# Patient Record
Sex: Female | Born: 2000 | Race: White | Hispanic: No | Marital: Single | Smoking: Never smoker
Health system: Southern US, Community
[De-identification: ages and names within clinical notes are randomized; demographics above are authoritative.]

## PROBLEM LIST (undated history)

## (undated) DIAGNOSIS — R531 Weakness: Secondary | ICD-10-CM

## (undated) HISTORY — PX: MYRINGOTOMY: SUR874

## (undated) HISTORY — PX: ADENOIDECTOMY: SUR15

## (undated) HISTORY — PX: OTHER SURGICAL HISTORY: SHX169

## (undated) HISTORY — PX: NOSE SURGERY: SHX723

## (undated) HISTORY — DX: Weakness: R53.1

---
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
## 2022-01-05 ENCOUNTER — Encounter: Payer: Self-pay | Admitting: Nurse Practitioner

## 2022-01-05 ENCOUNTER — Ambulatory Visit (INDEPENDENT_AMBULATORY_CARE_PROVIDER_SITE_OTHER): Payer: BC Managed Care – PPO | Admitting: Nurse Practitioner

## 2022-01-05 VITALS — BP 120/70 | HR 85 | Ht 66.5 in | Wt 183.0 lb

## 2022-01-05 DIAGNOSIS — N898 Other specified noninflammatory disorders of vagina: Secondary | ICD-10-CM | POA: Diagnosis not present

## 2022-01-05 DIAGNOSIS — Z8349 Family history of other endocrine, nutritional and metabolic diseases: Secondary | ICD-10-CM

## 2022-01-05 DIAGNOSIS — Z01419 Encounter for gynecological examination (general) (routine) without abnormal findings: Secondary | ICD-10-CM

## 2022-01-05 DIAGNOSIS — Z3046 Encounter for surveillance of implantable subdermal contraceptive: Secondary | ICD-10-CM

## 2022-01-05 LAB — WET PREP FOR TRICH, YEAST, CLUE

## 2022-01-05 NOTE — Progress Notes (Signed)
   Makayla [name] 2000/11/04 [id number]   History:  22 y.o. G0 presents for annual exam. Nexplanon 12/2019. Light irregular bleeding. H/O chlamydia 2021. Gardasil series completed. Mother with thyroid disease. Complains of intermittent discharge and odor.   Gynecologic History Patient's last menstrual period was 12/31/2021.   Contraception/Family planning: Nexplanon Sexually active: Yes, declines STD screening  Health Maintenance Last Pap: Not indicated Last mammogram: Not indicated Last colonoscopy: Not indicated Last Dexa: Not indicated   Past medical history, past surgical history, family history and social history were all reviewed and documented in the EPIC chart. 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 Studying abroad in [place] this semester.   ROS:  A ROS was performed and pertinent positives and negatives are included.  Exam:  Vitals:   01/05/22 1159  BP: 120/70  Pulse: 85  SpO2: 100%  Weight: 183 lb (83 kg)  Height: 5' 6.5" (1.689 m)    Body mass index is 29.09 kg/m.  General appearance:  Normal Thyroid:  Symmetrical, normal in size, without palpable masses or nodularity. Respiratory  Auscultation:  Clear without wheezing or rhonchi Cardiovascular  Auscultation:  Regular rate, without rubs, murmurs or gallops  Edema/varicosities:  Not grossly evident Abdominal  Soft,nontender, without masses, guarding or rebound.  Liver/spleen:  No organomegaly noted  Hernia:  None appreciated  Skin  Inspection:  Grossly normal   Breasts: Not indicated per guidelines Genitourinary   Inguinal/mons:  Normal without inguinal adenopathy  External genitalia:  Normal appearing vulva with no masses, tenderness, or lesions  BUS/Urethra/Skene's glands:  Normal  Vagina:  Normal appearing with normal color and discharge, no lesions  Cervix:  Normal appearing without discharge or lesions  Uterus:  Normal in size, shape and contour.  Midline and mobile,  nontender  Adnexa/parametria:     Rt: Normal in size, without masses or tenderness.   Lt: Normal in size, without masses or tenderness.  Anus and perineum: Normal  Patient informed chaperone available to be present for breast and pelvic exam. Patient has requested no chaperone to be present. Patient has been advised what will be completed during breast and pelvic exam.   Wet prep negative for pathogens  Assessment/Plan:  22 y.o. G0 for annual exam.   Well female exam with routine gynecological exam - Plan: CBC with Differential/Platelet, Comprehensive metabolic panel. Education provided on SBEs, importance of preventative screenings, current guidelines, high calcium diet, regular exercise, and multivitamin daily.   Family history of thyroid disease in mother - Plan: TSH  Vaginal odor - Plan: WET PREP FOR [place], [place], [place]. Negative wet prep and exam.   Encounter for surveillance of implantable subdermal contraceptive - Nexplanon inserted 12/2019. Light irregular bleeding.   Follow up in 1 year for annual.       Tamela Gammon [facility], 12:22 PM 01/05/2022

## 2022-01-06 LAB — COMPREHENSIVE METABOLIC PANEL
AG Ratio: 1.7 (calc) (ref 1.0–2.5)
ALT: 16 U/L (ref 5–32)
AST: 16 U/L (ref 12–32)
Albumin: 4.5 g/dL (ref 3.6–5.1)
Alkaline phosphatase (APISO): 61 U/L (ref 36–128)
BUN: 14 mg/dL (ref 7–20)
CO2: 22 mmol/L (ref 20–32)
Calcium: 9.4 mg/dL (ref 8.9–10.4)
Chloride: 105 mmol/L (ref 98–110)
Creat: 0.8 mg/dL (ref 0.50–0.96)
Globulin: 2.6 g/dL (calc) (ref 2.0–3.8)
Glucose, Bld: 89 mg/dL (ref 65–99)
Potassium: 4 mmol/L (ref 3.8–5.1)
Sodium: 140 mmol/L (ref 135–146)
Total Bilirubin: 0.3 mg/dL (ref 0.2–1.1)
Total Protein: 7.1 g/dL (ref 6.3–8.2)

## 2022-01-06 LAB — CBC WITH DIFFERENTIAL/PLATELET
Absolute Monocytes: 501 cells/uL (ref 200–950)
Basophils Absolute: 39 cells/uL (ref 0–200)
Basophils Relative: 0.7 %
Eosinophils Absolute: 319 cells/uL (ref 15–500)
Eosinophils Relative: 5.8 %
HCT: 43.6 % (ref 35.0–45.0)
Hemoglobin: 14.6 g/dL (ref 11.7–15.5)
Lymphs Abs: 1419 cells/uL (ref 850–3900)
MCH: 29.8 pg (ref 27.0–33.0)
MCHC: 33.5 g/dL (ref 32.0–36.0)
MCV: 89 fL (ref 80.0–100.0)
MPV: 9.8 fL (ref 7.5–12.5)
Monocytes Relative: 9.1 %
Neutro Abs: 3223 cells/uL (ref 1500–7800)
Neutrophils Relative %: 58.6 %
Platelets: 335 10*3/uL (ref 140–400)
RBC: 4.9 10*6/uL (ref 3.80–5.10)
RDW: 11.7 % (ref 11.0–15.0)
Total Lymphocyte: 25.8 %
WBC: 5.5 10*3/uL (ref 3.8–10.8)

## 2022-01-06 LAB — TSH: TSH: 3.8 mIU/L

## 2022-06-01 ENCOUNTER — Encounter: Payer: Self-pay | Admitting: Nurse Practitioner

## 2022-06-01 ENCOUNTER — Ambulatory Visit (INDEPENDENT_AMBULATORY_CARE_PROVIDER_SITE_OTHER): Payer: BC Managed Care – PPO | Admitting: Nurse Practitioner

## 2022-06-01 VITALS — BP 110/62 | HR 76 | Resp 20

## 2022-06-01 DIAGNOSIS — N921 Excessive and frequent menstruation with irregular cycle: Secondary | ICD-10-CM | POA: Diagnosis not present

## 2022-06-01 DIAGNOSIS — Z975 Presence of (intrauterine) contraceptive device: Secondary | ICD-10-CM

## 2022-06-01 NOTE — Progress Notes (Signed)
   Acute Office Visit  Subjective:    Patient ID: Makayla [name], female    DOB: 2000-06-03, 22 y.o.   MRN: [id number]   HPI 22 y.o. G0 presents today for irregular bleeding. While studying abroad this spring she had bleeding about every 9 days February - April. Bleeding was heavy enough at times to exchange tampons 2-3 times per day. LMP 04/19/2022. Nexplanon 12/2019. Not sexually active since December. Denies vaginal symptoms.   Patient's last menstrual period was 04/19/2022 (exact date). Period Duration (Days): 9 Period Pattern: (!) Irregular Menstrual Flow: Light Menstrual Control: Tampon Menstrual Control Change Freq (Hours): 2-3 Dysmenorrhea: (!) Mild Dysmenorrhea Symptoms: Cramping  Review of Systems  Constitutional: Negative.   Genitourinary:  Positive for menstrual problem. Negative for vaginal discharge and vaginal pain.       Objective:    Physical Exam Constitutional:      Appearance: Normal appearance.   GU: Not indicated  BP 110/62 (BP Location: Right Arm, Patient Position: Sitting)   Pulse 76   Resp 20   LMP 04/19/2022 (Exact Date)   SpO2 96%  Wt Readings from Last 3 Encounters:  01/05/22 183 lb (83 kg) (95 %, Z= 1.66)*  11/19/19 161 lb (73 kg) (91 %, Z= 1.33)*  10/17/18 171 lb 6.4 oz (77.7 kg) (95 %, Z= 1.61)*   * Growth percentiles are based on CDC (Girls, 2-20 Years) data.        Assessment & Plan:   Problem List Items Addressed This Visit   None Visit Diagnoses     Breakthrough bleeding on Nexplanon    -  Primary      Plan: Discussed bleeding patterns that can occur with Nexplanon. Bleeding stopped ~6 weeks ago. Will monitor and if irregular bleeding returns will do low dose COCs for a couple of months.      Olivia Mackie DNP, 8:58 AM 06/01/2022

## 2022-08-09 ENCOUNTER — Encounter: Payer: Self-pay | Admitting: Nurse Practitioner

## 2022-08-15 ENCOUNTER — Other Ambulatory Visit: Payer: Self-pay | Admitting: Nurse Practitioner

## 2022-08-15 DIAGNOSIS — N921 Excessive and frequent menstruation with irregular cycle: Secondary | ICD-10-CM

## 2022-08-15 MED ORDER — NORETHIN ACE-ETH ESTRAD-FE 1-20 MG-MCG PO TABS: 1.0000 | ORAL_TABLET | Freq: Every day | ORAL | 0 refills | Status: DC

## 2022-10-30 ENCOUNTER — Other Ambulatory Visit: Payer: Self-pay | Admitting: Nurse Practitioner

## 2022-10-30 DIAGNOSIS — N921 Excessive and frequent menstruation with irregular cycle: Secondary | ICD-10-CM

## 2022-10-31 NOTE — Telephone Encounter (Signed)
Med refill request: BCPs Last AEX: 01/05/2022-TW Next AEX: recall placed for 01/2023 Last MMG (if hormonal med): n/a Refill authorized: rx pend.  Rx'd for BTB on nexplanon. Per encounter dated 08/09/2022-pt desired to continue w/ additional low dose BCPs until replacement of BC.

## 2022-11-09 ENCOUNTER — Encounter: Payer: Self-pay | Admitting: Nurse Practitioner

## 2022-11-30 ENCOUNTER — Encounter: Payer: Self-pay | Admitting: Nurse Practitioner

## 2022-11-30 ENCOUNTER — Ambulatory Visit (INDEPENDENT_AMBULATORY_CARE_PROVIDER_SITE_OTHER): Payer: BC Managed Care – PPO | Admitting: Nurse Practitioner

## 2022-11-30 VITALS — BP 110/64 | HR 73

## 2022-11-30 DIAGNOSIS — Z3009 Encounter for other general counseling and advice on contraception: Secondary | ICD-10-CM | POA: Diagnosis not present

## 2022-11-30 MED ORDER — MISOPROSTOL 200 MCG PO TABS: 400.0000 ug | ORAL_TABLET | Freq: Once | ORAL | 0 refills | Status: DC

## 2022-11-30 NOTE — Progress Notes (Signed)
   Acute Office Visit  Subjective:    Patient ID: Makayla [name], female    DOB: 03-24-2000, 22 y.o.   MRN: [id number]   HPI 22 y.o. G0 presents today for IUD consult. Nexplanon 12/2019. Currently taking COCs for breakthrough bleeding. Interested in hormonal IUD. Sister and friend have them. Not currently sexually active.   Patient's last menstrual period was 11/03/2022. Period Duration (Days): 4-5 Period Pattern: Regular Menstrual Flow: Moderate Menstrual Control: Tampon Dysmenorrhea: (!) Moderate Dysmenorrhea Symptoms: Cramping  Review of Systems  Constitutional: Negative.   Genitourinary: Negative.        Objective:    Physical Exam Constitutional:      Appearance: Normal appearance.   GU: Not indicated  BP 110/64   Pulse 73   LMP 11/03/2022 Comment: nexplanon & ocps  SpO2 98%  Wt Readings from Last 3 Encounters:  01/05/22 183 lb (83 kg) (95%, Z= 1.66)*  11/19/19 161 lb (73 kg) (91%, Z= 1.33)*  10/17/18 171 lb 6.4 oz (77.7 kg) (95%, Z= 1.61)*   * Growth percentiles are based on CDC (Girls, 2-20 Years) data.         Assessment & Plan:   Problem List Items Addressed This Visit   None Visit Diagnoses     General counseling and advice on female contraception    -  Primary   Relevant Medications   misoprostol (CYTOTEC) 200 MCG tablet   Other Relevant Orders   IUD Insertion      Plan: Contraceptive options were reviewed, including hormonal methods, both combination (pill, patch, vaginal ring) and progesterone-only (pill, Depo Provera and Nexplanon), intrauterine devices (Mirena, [place], [place], and [place]), Phexxi, barrier methods (condoms, diaphragm) and female/female sterilization. The mechanisms, risks, benefits and side effects of all methods were discussed. Interested in [place] IUD. Cytotec vaginally at least 12 hours prior to procedure, Ibuprofen 30-60 minutes prior. Nexplanon expires next month. Will continue COCs. All questions answered.      Olivia Mackie DNP, 11:41 AM 11/30/2022

## 2022-12-30 ENCOUNTER — Encounter: Payer: Self-pay | Admitting: Nurse Practitioner

## 2023-01-03 ENCOUNTER — Ambulatory Visit (INDEPENDENT_AMBULATORY_CARE_PROVIDER_SITE_OTHER): Payer: BC Managed Care – PPO | Admitting: Nurse Practitioner

## 2023-01-03 ENCOUNTER — Encounter: Payer: Self-pay | Admitting: Nurse Practitioner

## 2023-01-03 VITALS — BP 102/64 | HR 97

## 2023-01-03 DIAGNOSIS — Z3046 Encounter for surveillance of implantable subdermal contraceptive: Secondary | ICD-10-CM

## 2023-01-03 DIAGNOSIS — Z30430 Encounter for insertion of intrauterine contraceptive device: Secondary | ICD-10-CM

## 2023-01-03 MED ORDER — LEVONORGESTREL 19.5 MG IU IUD: INTRAUTERINE_SYSTEM | Freq: Once | INTRAUTERINE | Status: AC

## 2023-01-03 NOTE — Progress Notes (Signed)
 22 y.o. G0P0000 presents for Nexplanon  removal and Kyleena  IUD insertion.  Procedure, risks and benefits have all been explained.  She has the following questions today:  none. Cytotec  self- administered. Not sexually active. Has been taking COCs for breakthrough bleeding with Nexplanon . Mother present.   LMP:  12/31/2022   After all questions were answered, consent was obtained.    Past Medical History:  Diagnosis Date   Asthenia     Past Surgical History:  Procedure Laterality Date   ADENOIDECTOMY     MYRINGOTOMY     nexplanon  insertion     NOSE SURGERY      Current Outpatient Medications on File Prior to Visit  Medication Sig Dispense Refill   albuterol  (VENTOLIN  HFA) 108 (90 Base) MCG/ACT inhaler Inhale into the lungs.     beclomethasone (QVAR REDIHALER) 80 MCG/ACT inhaler 1 puff Inhalation Twice a day for 30 days     BREO ELLIPTA 100-25 MCG/ACT AEPB Inhale 1 puff into the lungs daily.     clobetasol (TEMOVATE) 0.05 % external solution 1 app applied topically to affected areas on scalp 2 times a week as needed for flares for 14 days     hydrocortisone 2.5 % cream 1 app applied topically to rash behind ears 2 times a day for 30 days     ibuprofen (ADVIL,MOTRIN) 200 MG tablet Take 400 mg by mouth every 6 (six) hours as needed for moderate pain.     ketoconazole (NIZORAL) 2 % shampoo PLEASE SEE ATTACHED FOR DETAILED DIRECTIONS     montelukast (SINGULAIR) 10 MG tablet Take 10 mg by mouth at bedtime.     No current facility-administered medications on file prior to visit.   No Known Allergies  Vitals:   01/03/23 1412  BP: 102/64  Pulse: 97  SpO2: 98%   Physical Exam  Pelvic exam: Vulva:  normal female genitalia Vagina:  normal vagina, no discharge, exudate, lesion, or erythema Cervix:  Non-tender, Negative CMT, no lesions or redness. Uterus:  normal shape, position and consistency   Procedure: IUD insertion.  Speculum inserted.   Cervix visualized and cleansed with  Betadine x 3.  Sterile gloves applied. Tenaculum placed on anterior cervix. Then uterus sounded to 6 cm. IUD inserted easily. Strings trimmed to 3 cm.  Minimal bleeding noted.  Pt tolerated the procedure well.  Procedure: Nexplanon  removal. Patient placed supine on exam table with her left arm flexed at the elbow. The prior insertion site was located and the Nexplanon  rod was palpated.  Area cleansed with Betadine x 3 and draped in normal sterile fashion.  Insertion site and surrounding tissue anesthetized with 1% Lidocaine without epinephrine, 3 cc total used.  Small incision made with #11 blade.  Nexplanon  removed without difficulty.  Steri-strips were applied and pressure dressing placed over the site.  Entire procedure performed with sterile technique.  Pt tolerated procedure well.  Chaperone present: Zada Louder, CMA  Assessment:  Encounter for IUD insertion - Plan: IUD Insertion, levonorgestrel  (KYLEENA ) 19.5 MG IUD  Nexplanon  removal - Plan: Removal of implanon  rod   Return for IUD check 4-6 weeks Pt aware removal due no later than 5 years from insertion date, IUD card given to pt. Post procedure instructions for nexplanon  removal reviewed with pt.  Questions answered.  Pt knows to call with any concerns or questions.

## 2023-01-29 ENCOUNTER — Other Ambulatory Visit: Payer: Self-pay | Admitting: Nurse Practitioner

## 2023-01-30 ENCOUNTER — Ambulatory Visit: Payer: BC Managed Care – PPO | Admitting: Nurse Practitioner

## 2023-01-30 NOTE — Telephone Encounter (Signed)
Med refill request: [facility] Last AEX:01/05/2022 Next AEX: 02/13/23 Last MMG (if hormonal med) n/a Refill authorized: Patient had IUD insertion on 01/03/2023. No record of previous OCP on file. Please approve or deny as appropriate.

## 2023-02-13 ENCOUNTER — Ambulatory Visit: Payer: BC Managed Care – PPO | Admitting: Nurse Practitioner

## 2023-02-16 ENCOUNTER — Ambulatory Visit: Payer: BC Managed Care – PPO | Admitting: Nurse Practitioner

## 2024-02-26 ENCOUNTER — Ambulatory Visit: Admitting: Nurse Practitioner
